# Patient Record
Sex: Female | Born: 1995 | Race: White | Hispanic: No | Marital: Single | Smoking: Former smoker
Health system: Southern US, Community
[De-identification: ages and names within clinical notes are randomized; demographics above are authoritative.]

## PROBLEM LIST (undated history)

## (undated) DIAGNOSIS — F419 Anxiety disorder, unspecified: Secondary | ICD-10-CM

---
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
## 2018-10-04 ENCOUNTER — Encounter: Payer: Self-pay | Admitting: Emergency Medicine

## 2018-10-04 DIAGNOSIS — S022XXA Fracture of nasal bones, initial encounter for closed fracture: Secondary | ICD-10-CM | POA: Insufficient documentation

## 2018-10-04 DIAGNOSIS — F17210 Nicotine dependence, cigarettes, uncomplicated: Secondary | ICD-10-CM | POA: Insufficient documentation

## 2018-10-04 DIAGNOSIS — Y9301 Activity, walking, marching and hiking: Secondary | ICD-10-CM | POA: Insufficient documentation

## 2018-10-04 DIAGNOSIS — Y998 Other external cause status: Secondary | ICD-10-CM | POA: Insufficient documentation

## 2018-10-04 DIAGNOSIS — Y929 Unspecified place or not applicable: Secondary | ICD-10-CM | POA: Insufficient documentation

## 2018-10-04 DIAGNOSIS — W01198A Fall on same level from slipping, tripping and stumbling with subsequent striking against other object, initial encounter: Secondary | ICD-10-CM | POA: Insufficient documentation

## 2018-10-04 NOTE — ED Triage Notes (Signed)
Patient c/o fall, struck head on floor. Patient c/o nasal, facial pain. Patient denies LOC and visual changes. Abrasion noted to nose.

## 2018-10-04 NOTE — ED Notes (Signed)
No orders at this time per MD York Cerise

## 2018-10-05 ENCOUNTER — Other Ambulatory Visit: Payer: Self-pay

## 2018-10-05 ENCOUNTER — Emergency Department
Admission: EM | Admit: 2018-10-05 | Discharge: 2018-10-05 | Disposition: A | Discharge status: Home / Self Care | Payer: Self-pay | Attending: Emergency Medicine | Admitting: Emergency Medicine

## 2018-10-05 DIAGNOSIS — S022XXA Fracture of nasal bones, initial encounter for closed fracture: Secondary | ICD-10-CM

## 2018-10-05 MED ORDER — HYDROCODONE-ACETAMINOPHEN 5-325 MG PO TABS
1.0000 | ORAL_TABLET | Freq: Once | ORAL | Status: AC
  Administered 2018-10-05: 1 via ORAL
  Filled 2018-10-05: qty 1

## 2018-10-05 MED ORDER — IBUPROFEN 600 MG PO TABS: 600.0000 mg | ORAL_TABLET | Freq: Three times a day (TID) | ORAL | 0 refills | Status: DC | PRN

## 2018-10-05 MED ORDER — IBUPROFEN 600 MG PO TABS
600.0000 mg | ORAL_TABLET | Freq: Once | ORAL | Status: AC
  Administered 2018-10-05: 600 mg via ORAL
  Filled 2018-10-05: qty 1

## 2018-10-05 MED ORDER — HYDROCODONE-ACETAMINOPHEN 5-325 MG PO TABS: 1.0000 | ORAL_TABLET | Freq: Four times a day (QID) | ORAL | 0 refills | Status: DC | PRN

## 2018-10-05 MED ORDER — OXYMETAZOLINE HCL 0.05 % NA SOLN
1.0000 | Freq: Once | NASAL | Status: AC
  Administered 2018-10-05: 1 via NASAL
  Filled 2018-10-05: qty 15

## 2018-10-05 NOTE — Discharge Instructions (Signed)
Please take your pain medication as needed for severe symptoms and follow-up with the ear nose and throat specialist in 5 to 7 days for recheck.  Return to the emergency department sooner for any concerns.  While here in the ER today you received very powerful medicine that makes it unsafe for you to drive for the rest of the day.  Do not drive until tomorrow.  It was a pleasure to take care of you today, and thank you for coming to our emergency department.  If you have any questions or concerns before leaving please ask the nurse to grab me and I'm more than happy to go through your aftercare instructions again.  If you were prescribed any opioid pain medication today such as Norco, Vicodin, Percocet, morphine, hydrocodone, or oxycodone please make sure you do not drive when you are taking this medication as it can alter your ability to drive safely.  If you have any concerns once you are home that you are not improving or are in fact getting worse before you can make it to your follow-up appointment, please do not hesitate to call 911 and come back for further evaluation.  Merrily Brittle, MD

## 2018-10-05 NOTE — ED Provider Notes (Signed)
[facility] Emergency Department Provider Note  ____________________________________________   First MD Initiated Contact with Patient 10/05/18 0308     (approximate)  I have reviewed the triage vital signs and the nursing notes.   HISTORY  Chief Complaint Fall   HPI Melinda Perez is a 23 y.o. female self presents to the emergency department with facial pain after she tripped and fell from ground-level striking her nose.  She did have brisk bleeding from her nose initially that is since resolved.  She now has persistent throbbing aching moderate severity discomfort in the front of her face.  She did have some tearing.  She is having congestion at this point.  No double vision or blurred vision.  No nausea or vomiting.  No numbness or weakness.  No seizure-like activity.    History reviewed. No pertinent past medical history.  There are no active problems to display for this patient.   History reviewed. No pertinent surgical history.  Prior to Admission medications   Medication Sig Start Date End Date Taking? Authorizing Provider  HYDROcodone-acetaminophen (NORCO) 5-325 MG tablet Take 1 tablet by mouth every 6 (six) hours as needed for up to 7 doses for severe pain. 10/05/18   Merrily Brittle, MD  ibuprofen (ADVIL,MOTRIN) 600 MG tablet Take 1 tablet (600 mg total) by mouth every 8 (eight) hours as needed. 10/05/18   Merrily Brittle, MD    Allergies Patient has no known allergies.  No family history on file.  Social History Social History   Tobacco Use  . Smoking status: Current Every Day Smoker    Packs/day: 0.50    Types: Cigarettes  . Smokeless tobacco: Never Used  Substance Use Topics  . Alcohol use: Yes  . Drug use: Yes    Review of Systems Constitutional: No fever/chills ENT: Positive for nose pain and congestion Cardiovascular: Denies chest pain. Respiratory: Denies shortness of breath. Gastrointestinal: No abdominal pain.  No  nausea, no vomiting.  Neurological: Positive for headaches   ____________________________________________   PHYSICAL EXAM:  VITAL SIGNS: ED Triage Vitals  Enc Vitals Group     BP 10/04/18 2337 117/81     Pulse Rate 10/04/18 2337 95     Resp 10/04/18 2337 18     Temp 10/04/18 2337 98.1 F (36.7 C)     Temp Source 10/04/18 2337 Oral     SpO2 10/04/18 2337 100 %     Weight 10/04/18 2338 140 lb (63.5 kg)     Height 10/04/18 2338 5\' 8"  (1.727 m)     Head Circumference --      Peak Flow --      Pain Score 10/04/18 2338 7     Pain Loc --      Pain Edu? --      Excl. in [place]? --     Constitutional: Alert and oriented x4 well-appearing nontoxic no diaphoresis speaks in full clear sentences Eyes: Pupils midrange and brisk.  Nose: No septal hematoma.  Left nare with evidence of recent anterior bleed.  Quite tender to the bridge of her nose Mouth/Throat: No trismus Neck: No stridor.   Cardiovascular: Regular rate and rhythm Respiratory: Normal respiratory effort.  No retractions. Neurologic:  Normal speech and language. No gross focal neurologic deficits are appreciated.  Skin:  Skin is warm, dry and intact. No rash noted.    ____________________________________________  LABS (all labs ordered are listed, but only abnormal results are displayed)  Labs Reviewed - No data  to display   __________________________________________  EKG   ____________________________________________  RADIOLOGY   ____________________________________________   DIFFERENTIAL includes but not limited to  Mechanical fall, nasal fracture, septal hematoma, domestic abuse   PROCEDURES  Procedure(s) performed: no  Procedures  Critical Care performed: no  ____________________________________________   INITIAL IMPRESSION / ASSESSMENT AND PLAN / ED COURSE  Pertinent labs & imaging results that were available during my care of the patient were reviewed by me and considered in my medical  decision making (see chart for details).   As part of my medical decision making, I reviewed the following data within the electronic MEDICAL RECORD NUMBER History obtained from family if available, nursing notes, old chart and ekg, as well as notes from prior ED visits.  The patient comes to the emergency department with a convincing story for mechanical fall sustaining a clinical broken nose.  Given Afrin here for congestion along with a tablet of hydrocodone and ibuprofen with improvement in her symptoms.  No indication for imaging and I will refer her to ENT in 5 to 7 days for reevaluation.  No septal hematoma.  Strict return precautions were given.      ____________________________________________   FINAL CLINICAL IMPRESSION(S) / ED DIAGNOSES  Final diagnoses:  Closed fracture of nasal bone, initial encounter      NEW MEDICATIONS STARTED DURING THIS VISIT:  Discharge Medication List as of 10/05/2018  3:16 AM    START taking these medications   Details  HYDROcodone-acetaminophen (NORCO) 5-325 MG tablet Take 1 tablet by mouth every 6 (six) hours as needed for up to 7 doses for severe pain., Starting Fri 10/05/2018, Print    ibuprofen (ADVIL,MOTRIN) 600 MG tablet Take 1 tablet (600 mg total) by mouth every 8 (eight) hours as needed., Starting Fri 10/05/2018, Print         Note:  This document was prepared using Dragon voice recognition software and may include unintentional dictation errors.     Merrily Brittle, MD 10/07/18 2152

## 2019-10-09 ENCOUNTER — Emergency Department
Admission: EM | Admit: 2019-10-09 | Discharge: 2019-10-09 | Disposition: A | Discharge status: Home / Self Care | Payer: No Typology Code available for payment source | Attending: Emergency Medicine | Admitting: Emergency Medicine

## 2019-10-09 ENCOUNTER — Other Ambulatory Visit: Payer: Self-pay

## 2019-10-09 ENCOUNTER — Encounter: Payer: Self-pay | Admitting: Emergency Medicine

## 2019-10-09 DIAGNOSIS — Y9389 Activity, other specified: Secondary | ICD-10-CM | POA: Insufficient documentation

## 2019-10-09 DIAGNOSIS — F17210 Nicotine dependence, cigarettes, uncomplicated: Secondary | ICD-10-CM | POA: Insufficient documentation

## 2019-10-09 DIAGNOSIS — W228XXA Striking against or struck by other objects, initial encounter: Secondary | ICD-10-CM | POA: Diagnosis not present

## 2019-10-09 DIAGNOSIS — Y92511 Restaurant or cafe as the place of occurrence of the external cause: Secondary | ICD-10-CM | POA: Insufficient documentation

## 2019-10-09 DIAGNOSIS — Y990 Civilian activity done for income or pay: Secondary | ICD-10-CM | POA: Insufficient documentation

## 2019-10-09 DIAGNOSIS — S025XXA Fracture of tooth (traumatic), initial encounter for closed fracture: Secondary | ICD-10-CM | POA: Insufficient documentation

## 2019-10-09 DIAGNOSIS — S0993XA Unspecified injury of face, initial encounter: Secondary | ICD-10-CM | POA: Diagnosis present

## 2019-10-09 MED ORDER — IBUPROFEN 800 MG PO TABS: 800.0000 mg | ORAL_TABLET | Freq: Three times a day (TID) | ORAL | 0 refills | Status: DC | PRN

## 2019-10-09 MED ORDER — AMOXICILLIN 500 MG PO CAPS: 500.0000 mg | ORAL_CAPSULE | Freq: Three times a day (TID) | ORAL | 0 refills | Status: DC

## 2019-10-09 MED ORDER — IBUPROFEN 800 MG PO TABS
800.0000 mg | ORAL_TABLET | Freq: Once | ORAL | Status: AC
  Administered 2019-10-09: 21:00:00 800 mg via ORAL
  Filled 2019-10-09: qty 1

## 2019-10-09 MED ORDER — AMOXICILLIN 500 MG PO CAPS
500.0000 mg | ORAL_CAPSULE | Freq: Once | ORAL | Status: AC
  Administered 2019-10-09: 21:00:00 500 mg via ORAL
  Filled 2019-10-09: qty 1

## 2019-10-09 NOTE — ED Notes (Signed)
Completed workers comp with EDT Gladys Damme

## 2019-10-09 NOTE — Discharge Instructions (Signed)
OPTIONS FOR DENTAL FOLLOW UP CARE ° °[place] Department of Health and Human Services - Local Safety Net Dental Clinics °http://www.ncdhhs.gov/dph/oralhealth/services/safetynetclinics.htm °  °[facility] (336-562-3123) ° °[facility] [place] ([phone]) ° °[facility] ([phone] ext [phone]) ° °[facility] ([phone]) ° °[facility] ([phone]) °This clinic caters to the indigent population and is on a lottery system. °Location: °[facility], [address], [place] °Clinic Hours: °Wednesdays from 6pm - 9pm, patients seen by a lottery system. °For dates, call or go to www.med.[facility].edu/[facility]/patients/Dental-[facility] °Services: °Cleanings, fillings and simple extractions. °Payment Options: °DENTAL WORK IS FREE OF CHARGE. Bring proof of income or support. °Best way to get seen: °Arrive at 5:15 pm - this is a lottery, NOT first come/first serve, so arriving earlier will not increase your chances of being seen. °  °  °[facility] °[phone] °Select option 1 for emergencies °  °Location: °[facility], [address], [place] °Clinic Hours: °No walk-ins accepted - call the day before to schedule an appointment. °Check in times are 9:30 am and 1:30 pm. °Services: °Simple extractions, temporary fillings, pulpectomy/pulp debridement, uncomplicated abscess drainage. °Payment Options: °PAYMENT IS DUE AT THE TIME OF SERVICE.  Fee is usually $100-200, additional surgical procedures (e.g. abscess drainage) may be extra. °Cash, checks, Visa/MasterCard accepted.  Can file Medicaid if patient is covered for dental - patient should call case worker to check. °No discount for [facility] patients. °Best way to get seen: °MUST call the day before and get onto the schedule. Can usually be seen the next 1-2 days. No walk-ins accepted. °  °  °[facility] °[phone] °   °Location: °[facility], [address], [place] °Clinic Hours: °M, W, Th, F 8am or 1:30pm, Tues 9a or 1:30 - first come/first served. °Services: °Simple extractions, temporary fillings, uncomplicated abscess drainage.  You do not need to be an [place] resident. °Payment Options: °PAYMENT IS DUE AT THE TIME OF SERVICE. °Dental insurance, otherwise sliding scale - bring proof of income or support. °Depending on income and treatment needed, cost is usually $50-200. °Best way to get seen: °Arrive early as it is first come/first served. °  °  °[facility] °[phone] °  °Location: °[address] °Clinic Hours: °Mon-Thu 8a-5p °Services: °Most basic dental services including extractions and fillings. °Payment Options: °PAYMENT IS DUE AT THE TIME OF SERVICE. °Sliding scale, up to 50% off - bring proof if income or support. °Medicaid with dental option accepted. °Best way to get seen: °Call to schedule an appointment, can usually be seen within 2 weeks OR they will try to see walk-ins - show up at 8a or 2p (you may have to wait). °  °  °[facility] °[phone] °[place] RESIDENTS ONLY °  °Location: °[facility], [address], [place], [place] [address] °Clinic Hours: By appointment only. °Monday - Thursday 8am-5pm, Friday 8am-12pm °Services: Cleanings, fillings, extractions. °Payment Options: °PAYMENT IS DUE AT THE TIME OF SERVICE. °Cash, Visa or MasterCard. Sliding scale - $30 minimum per service. °Best way to get seen: °Come in to office, complete packet and make an appointment - need proof of income °or support monies for each household member and proof of [place] residence. °Usually takes about a month to get in. °  °  °[facility] °[phone] °  °Location: °[address]   [place] °Clinic Hours: Walk-in Urgent Care Dental Services are offered Monday-Friday  mornings only. °The numbers of emergencies accepted daily is limited to the number of °providers available. °Maximum 15 - Mondays, Wednesdays & Thursdays °Maximum 10 - Tuesdays & Fridays °Services: °You do not need to be a [place] resident to be seen for a dental emergency. °Emergencies are defined as pain, swelling, abnormal bleeding, or dental trauma. Walkins will receive x-rays if needed. °NOTE: Dental cleaning is not an emergency. °Payment Options: °PAYMENT IS DUE AT THE TIME OF SERVICE. °Minimum co-pay is $40.00 for uninsured patients. °Minimum co-pay is $3.00 for Medicaid with dental coverage. °Dental Insurance is accepted and must be presented at time of visit. °Medicare does not cover dental. °Forms of payment: Cash, credit card, checks. °Best way to get seen: °If not previously registered with the clinic, walk-in dental registration begins at 7:15 am and is on a first come/first serve basis. °If previously registered with the clinic, call to make an appointment. °  °  °[facility] °[phone] °[place] RESIDENTS ONLY °  °Location: °[address], [place],  °Clinic Hours: °Mon-Thu 10a-2p °Services: Extractions only! °Payment Options: °FREE (donations accepted) - bring proof of income or support °Best way to get seen: °Call and schedule an appointment OR come at 8am on the 1st Monday of every month (except for holidays) when it is first come/first served. °  °  °[facility] °[phone] °  °Location: °[address], [place] °Clinic Hours: °Friday mornings °Services, Payment Options, Best way to get seen: °Call for info °

## 2019-10-09 NOTE — ED Provider Notes (Signed)
[facility] Emergency Department Provider Note  ____________________________________________   First MD Initiated Contact with Patient 10/09/19 2042     (approximate)  I have reviewed the triage vital signs and the nursing notes.   HISTORY  Chief Complaint Mouth Injury    HPI Melinda Perez is a 24 y.o. female presents emergency department stating that on Saturday while at work, she was reaching up to get pizza boxes when her arm gave way and the box fell into her face causing her tooth to crack and injuring the gum of her mouth.  States she is continued have pain in the area.  She states the tooth is actually a baby tooth that never fell out.  She denies any headache, neck pain or other injury.    History reviewed. No pertinent past medical history.  There are no problems to display for this patient.   History reviewed. No pertinent surgical history.  Prior to Admission medications   Medication Sig Start Date End Date Taking? Authorizing Provider  amoxicillin (AMOXIL) 500 MG capsule Take 1 capsule (500 mg total) by mouth 3 (three) times daily. 10/09/19   Raaga Maeder, Linden Dolin, PA-C  ibuprofen (ADVIL) 800 MG tablet Take 1 tablet (800 mg total) by mouth every 8 (eight) hours as needed. 10/09/19   Versie Starks, PA-C    Allergies Patient has no known allergies.  No family history on file.  Social History Social History   Tobacco Use  . Smoking status: Current Every Day Smoker    Packs/day: 0.50    Types: Cigarettes  . Smokeless tobacco: Never Used  Substance Use Topics  . Alcohol use: Yes  . Drug use: Yes    Review of Systems  Constitutional: No fever/chills Eyes: No visual changes. ENT: No sore throat.  Positive for tooth pain Respiratory: Denies cough Genitourinary: Negative for dysuria. Musculoskeletal: Negative for back pain. Skin: Negative for rash.     ____________________________________________   PHYSICAL EXAM:  VITAL  SIGNS: ED Triage Vitals [10/09/19 1818]  Enc Vitals Group     BP 117/75     Pulse Rate 78     Resp 16     Temp 98.5 F (36.9 C)     Temp Source Oral     SpO2 100 %     Weight 135 lb (61.2 kg)     Height 5\' 8"  (1.727 m)     Head Circumference      Peak Flow      Pain Score 4     Pain Loc      Pain Edu?      Excl. in [place]?     Constitutional: Alert and oriented. Well appearing and in no acute distress. Eyes: Conjunctivae are normal.  Head: Atraumatic. Nose: No congestion/rhinnorhea. Mouth/Throat: Mucous membranes are moist.  Positive for a broken tooth in the left upper molars, no bleeding noted at this time Neck:  supple no lymphadenopathy noted Cardiovascular: Normal rate, regular rhythm.  Respiratory: Normal respiratory effort.  No retractions,  GU: deferred Musculoskeletal: FROM all extremities, warm and well perfused Neurologic:  Normal speech and language.  Skin:  Skin is warm, dry and intact. No rash noted. Psychiatric: Mood and affect are normal. Speech and behavior are normal.  ____________________________________________   LABS (all labs ordered are listed, but only abnormal results are displayed)  Labs Reviewed - No data to display ____________________________________________   ____________________________________________  RADIOLOGY    ____________________________________________   PROCEDURES  Procedure(s)  performed: Amoxicillin 500 mg p.o., ibuprofen 800 mg p.o.   Procedures    ____________________________________________   INITIAL IMPRESSION / ASSESSMENT AND PLAN / ED COURSE  Pertinent labs & imaging results that were available during my care of the patient were reviewed by me and considered in my medical decision making (see chart for details).   Patient is 24 year old female presents emergency department due to a mouth injury   Physical exam shows patient very well.  Broken tooth noted in the left upper molars.  Explained findings to  the patient.  She is to follow-up with a dentist.  She was given prescription for amoxicillin and ibuprofen.  She was discharged in stable condition.    Melinda Perez was evaluated in Emergency Department on 10/09/2019 for the symptoms described in the history of present illness. She was evaluated in the context of the global COVID-19 pandemic, which necessitated consideration that the patient might be at risk for infection with the SARS-CoV-2 virus that causes COVID-19. Institutional protocols and algorithms that pertain to the evaluation of patients at risk for COVID-19 are in a state of rapid change based on information released by regulatory bodies including the CDC and federal and state organizations. These policies and algorithms were followed during the patient's care in the ED.   As part of my medical decision making, I reviewed the following data within the electronic MEDICAL RECORD NUMBER Nursing notes reviewed and incorporated, Old chart reviewed, Notes from prior ED visits and [place] Controlled Substance Database  ____________________________________________   FINAL CLINICAL IMPRESSION(S) / ED DIAGNOSES  Final diagnoses:  Closed broken tooth due to trauma without complication, initial encounter      NEW MEDICATIONS STARTED DURING THIS VISIT:  New Prescriptions   AMOXICILLIN (AMOXIL) 500 MG CAPSULE    Take 1 capsule (500 mg total) by mouth 3 (three) times daily.   IBUPROFEN (ADVIL) 800 MG TABLET    Take 1 tablet (800 mg total) by mouth every 8 (eight) hours as needed.     Note:  This document was prepared using Dragon voice recognition software and may include unintentional dictation errors.    Faythe Ghee, PA-C 10/09/19 2055    Emily Filbert, MD 10/09/19 2118

## 2019-10-09 NOTE — ED Triage Notes (Signed)
Patient states she was taking boxes off a shelf at work when a box slipped off the top, hitting her in the mouth. Reports pain to gums and a broken tooth. No bleeding noted.

## 2020-10-10 ENCOUNTER — Encounter: Payer: Self-pay | Admitting: Emergency Medicine

## 2020-10-10 ENCOUNTER — Emergency Department
Admission: EM | Admit: 2020-10-10 | Discharge: 2020-10-10 | Disposition: A | Discharge status: Home / Self Care | Payer: Self-pay | Attending: Emergency Medicine | Admitting: Emergency Medicine

## 2020-10-10 ENCOUNTER — Other Ambulatory Visit: Payer: Self-pay

## 2020-10-10 ENCOUNTER — Emergency Department: Payer: Self-pay

## 2020-10-10 DIAGNOSIS — R0789 Other chest pain: Secondary | ICD-10-CM

## 2020-10-10 DIAGNOSIS — Z87891 Personal history of nicotine dependence: Secondary | ICD-10-CM | POA: Insufficient documentation

## 2020-10-10 HISTORY — DX: Anxiety disorder, unspecified: F41.9

## 2020-10-10 LAB — BASIC METABOLIC PANEL
Anion gap: 11 (ref 5–15)
BUN: 8 mg/dL (ref 6–20)
CO2: 25 mmol/L (ref 22–32)
Calcium: 9.6 mg/dL (ref 8.9–10.3)
Chloride: 102 mmol/L (ref 98–111)
Creatinine, Ser: 0.71 mg/dL (ref 0.44–1.00)
GFR, Estimated: >60 mL/min (ref >60)
Glucose, Bld: 104 mg/dL — ABNORMAL HIGH (ref 70–99)
Potassium: 3.5 mmol/L (ref 3.5–5.1)
Sodium: 138 mmol/L (ref 135–145)

## 2020-10-10 LAB — CBC
HCT: 43 % (ref 36.0–46.0)
Hemoglobin: 14.8 g/dL (ref 12.0–15.0)
MCH: 30.8 pg (ref 26.0–34.0)
MCHC: 34.4 g/dL (ref 30.0–36.0)
MCV: 89.4 fL (ref 80.0–100.0)
Platelets: 240 10*3/uL (ref 150–400)
RBC: 4.81 MIL/uL (ref 3.87–5.11)
RDW: 11.9 % (ref 11.5–15.5)
WBC: 5.9 10*3/uL (ref 4.0–10.5)
nRBC: 0 % (ref 0.0–0.2)

## 2020-10-10 LAB — TROPONIN I (HIGH SENSITIVITY): Troponin I (High Sensitivity): <2 ng/L (ref <18)

## 2020-10-10 LAB — POC URINE PREG, ED: Preg Test, Ur: NEGATIVE

## 2020-10-10 MED ORDER — KETOROLAC TROMETHAMINE 30 MG/ML IJ SOLN
30.0000 mg | Freq: Once | INTRAMUSCULAR | Status: AC
  Administered 2020-10-10: 30 mg via INTRAMUSCULAR
  Filled 2020-10-10: qty 1

## 2020-10-10 MED ORDER — KETOROLAC TROMETHAMINE 10 MG PO TABS: 10.0000 mg | ORAL_TABLET | Freq: Four times a day (QID) | ORAL | 0 refills | Status: AC | PRN

## 2020-10-10 NOTE — Discharge Instructions (Signed)
Please take Toradol up to 4 times a day for the next 5 days to help with chest wall pain. Please limit caffeine at home to minimize anxiety.   Please consider stress management techniques such as yoga, meditation, daily exercise, reading and other activities that you enjoy. I hope you feel better soon.

## 2020-10-10 NOTE — ED Triage Notes (Signed)
Pt presents to the ED via POV. Pt c/o L sided chest pressure and tightness. Pt also states that she has been having intermittent SOB. Pt states she think she had nicotine poisoning and stopped smoking cold [place] 2 days ago. Pt is A&Ox4 and NAD.

## 2020-10-10 NOTE — ED Provider Notes (Signed)
[facility]-EMERGENCY DEPARTMENT  ____________________________________________  Time seen: Approximately 7:22 PM  I have reviewed the triage vital signs and the nursing notes.   HISTORY  Chief Complaint Chest Pain   Historian Patient     HPI Melinda Perez is a 25 y.o. female presents to the emergency department with left-sided chest pain and tightness.  Patient states that she had a panic attack earlier in the week and patient states that she has had reproducible chest wall discomfort since her panic attack occurred.  She states that she is also trying to quit smoking and has been using some nicotine patches and was concerned that her chest pain might be from using her nicotine patches.  She denies rhinorrhea, nasal congestion or nonproductive cough.  She is not currently smoking and has not been taking any birth control.  She denies any history of cardiac issues in the past.  She states that her appetite has been diminished due to anxiety over the past several weeks and she has been concerned.  No other alleviating measures have been attempted.   Past Medical History:  Diagnosis Date  . Anxiety      Immunizations up to date:  Yes.     Past Medical History:  Diagnosis Date  . Anxiety     There are no problems to display for this patient.   No past surgical history on file.  Prior to Admission medications   Medication Sig Start Date End Date Taking? Authorizing Provider  ketorolac (TORADOL) 10 MG tablet Take 1 tablet (10 mg total) by mouth every 6 (six) hours as needed for up to 5 days. 10/10/20 10/15/20 Yes Pia Mau M, PA-C  amoxicillin (AMOXIL) 500 MG capsule Take 1 capsule (500 mg total) by mouth 3 (three) times daily. 10/09/19   Fisher, Roselyn Bering, PA-C  ibuprofen (ADVIL) 800 MG tablet Take 1 tablet (800 mg total) by mouth every 8 (eight) hours as needed. 10/09/19   Faythe Ghee, PA-C    Allergies Patient has no known allergies.  No family history on  file.  Social History Social History   Tobacco Use  . Smoking status: Former Smoker    Packs/day: 0.50    Types: Cigarettes  . Smokeless tobacco: Never Used  Substance Use Topics  . Alcohol use: Yes  . Drug use: Yes     Review of Systems  Constitutional: No fever/chills Eyes:  No discharge ENT: No upper respiratory complaints. Respiratory: no cough. No SOB/ use of accessory muscles to breath. Cardiac: Patient has left sided chest pain.  Gastrointestinal:   No nausea, no vomiting.  No diarrhea.  No constipation. Musculoskeletal: Negative for musculoskeletal pain. Skin: Negative for rash, abrasions, lacerations, ecchymosis.    ____________________________________________   PHYSICAL EXAM:  VITAL SIGNS: ED Triage Vitals  Enc Vitals Group     BP 10/10/20 1427 126/90     Pulse Rate 10/10/20 1427 94     Resp 10/10/20 1427 16     Temp 10/10/20 1427 98.7 F (37.1 C)     Temp Source 10/10/20 1427 Oral     SpO2 10/10/20 1427 96 %     Weight 10/10/20 1435 125 lb (56.7 kg)     Height 10/10/20 1435 5\' 8"  (1.727 m)     Head Circumference --      Peak Flow --      Pain Score 10/10/20 1435 4     Pain Loc --      Pain Edu? --  Excl. in GC? --      Constitutional: Alert and oriented. Well appearing and in no acute distress. Eyes: Conjunctivae are normal. PERRL. EOMI. Head: Atraumatic. Cardiovascular: Normal rate, regular rhythm. Normal S1 and S2.  Good peripheral circulation.  Patient has reproducible left-sided chest wall pain to palpation. Respiratory: Normal respiratory effort without tachypnea or retractions. Lungs CTAB. Good air entry to the bases with no decreased or absent breath sounds Gastrointestinal: Bowel sounds x 4 quadrants. Soft and nontender to palpation. No guarding or rigidity. No distention. Musculoskeletal: Full range of motion to all extremities. No obvious deformities noted Neurologic:  Normal for age. No gross focal neurologic deficits are  appreciated.  Skin:  Skin is warm, dry and intact. No rash noted. Psychiatric: Mood and affect are normal for age. Speech and behavior are normal.   ____________________________________________   LABS (all labs ordered are listed, but only abnormal results are displayed)  Labs Reviewed  BASIC METABOLIC PANEL - Abnormal; Notable for the following components:      Result Value   Glucose, Bld 104 (*)    All other components within normal limits  CBC  POC URINE PREG, ED  TROPONIN I (HIGH SENSITIVITY)  TROPONIN I (HIGH SENSITIVITY)   ____________________________________________  EKG   ____________________________________________  RADIOLOGY Geraldo Pitter, personally viewed and evaluated these images (plain radiographs) as part of my medical decision making, as well as reviewing the written report by the radiologist.  DG Chest 2 View  Result Date: 10/10/2020 CLINICAL DATA:  Chest pain EXAM: CHEST - 2 VIEW COMPARISON:  None. FINDINGS: Lungs are clear.  No pleural effusion or pneumothorax. The heart is normal in size. Visualized osseous structures are within normal limits. IMPRESSION: Normal chest radiographs. Electronically Signed   By: Charline Bills M.D.   On: 10/10/2020 15:18    ____________________________________________    PROCEDURES  Procedure(s) performed:     Procedures     Medications  ketorolac (TORADOL) 30 MG/ML injection 30 mg (has no administration in time range)     ____________________________________________   INITIAL IMPRESSION / ASSESSMENT AND PLAN / ED COURSE  Pertinent labs & imaging results that were available during my care of the patient were reviewed by me and considered in my medical decision making (see chart for details).     Assessment and plan Chest wall pain 25 year old female presents to the emergency department with reproducible left-sided chest wall tenderness to palpation that is occurred for the past several days  following a panic attack.  Vital signs were reassuring at triage.  On physical exam, patient was alert, active and nontoxic-appearing.  She had no increased work of breathing and no adventitious lung sounds were auscultated.  CBC and BMP were reassuring.  Troponin was within reference range.  Urine pregnancy testing was negative.  No consolidations, opacities or infiltrates on chest x-ray.  No signs of cardiac enlargement.  No evidence of pneumothorax.  Highly suspicious for costochondritis secondary to anxiety.  Patient I had an extensive conversation about stress management techniques.  Also recommended that if patient continues to have as much anxiety as she is currently having, to establish care with a primary care provider to discuss possible medication management.  She was given an injection of Toradol in the emergency department for costochondritis and discharged with oral Toradol.  Return precautions were given to return with new or worsening symptoms.  All patient questions were answered.      ____________________________________________  FINAL CLINICAL IMPRESSION(S) / ED  DIAGNOSES  Final diagnoses:  Chest wall pain      NEW MEDICATIONS STARTED DURING THIS VISIT:  ED Discharge Orders         Ordered    ketorolac (TORADOL) 10 MG tablet  Every 6 hours PRN        10/10/20 1913              This chart was dictated using voice recognition software/Dragon. Despite best efforts to proofread, errors can occur which can change the meaning. Any change was purely unintentional.     Orvil Feil, PA-C 10/10/20 Pollie Meyer, MD 10/10/20 [phone]

## 2021-03-09 ENCOUNTER — Other Ambulatory Visit: Payer: Self-pay

## 2021-03-09 ENCOUNTER — Emergency Department
Admission: EM | Admit: 2021-03-09 | Discharge: 2021-03-09 | Disposition: A | Discharge status: Home / Self Care | Payer: Self-pay | Attending: Emergency Medicine | Admitting: Emergency Medicine

## 2021-03-09 DIAGNOSIS — R419 Unspecified symptoms and signs involving cognitive functions and awareness: Secondary | ICD-10-CM | POA: Insufficient documentation

## 2021-03-09 DIAGNOSIS — U071 COVID-19: Secondary | ICD-10-CM | POA: Insufficient documentation

## 2021-03-09 DIAGNOSIS — Z87891 Personal history of nicotine dependence: Secondary | ICD-10-CM | POA: Insufficient documentation

## 2021-03-09 NOTE — ED Triage Notes (Signed)
Pt to ED POV for COVID+, states she is getting better but is having "brain fog" and wants to be checked out for that.  Pt in NAD, RR even and unlabored. Ambulatory, NAD noted  Started having sx on Friday

## 2021-03-09 NOTE — ED Provider Notes (Signed)
[facility]-EMERGENCY DEPARTMENT  ____________________________________________  Time seen: Approximately 6:35 PM  I have reviewed the triage vital signs and the nursing notes.   HISTORY  Chief Complaint COVID+   Historian Patient    HPI Melinda Perez is a 25 y.o. female presents to the emergency department with concern for brain fog that has developed since patient was diagnosed with COVID-19 2 days ago.  Patient states that she normally smokes marijuana but has not been smoking as much since having COVID.  She has had occasional cough body aches and mild diarrhea.  No chest pain, chest tightness or shortness of breath. Patient denies headache. No other alleviating measures have been attempted.     Past Medical History:  Diagnosis Date   Anxiety      Immunizations up to date:  Yes.     Past Medical History:  Diagnosis Date   Anxiety     There are no problems to display for this patient.   No past surgical history on file.  Prior to Admission medications   Not on File    Allergies Patient has no known allergies.  No family history on file.  Social History Social History   Tobacco Use   Smoking status: Former    Packs/day: 0.50    Pack years: 0.00    Types: Cigarettes   Smokeless tobacco: Never  Substance Use Topics   Alcohol use: Yes   Drug use: Yes     Review of Systems  Constitutional: No fever/chills Eyes:  No discharge ENT: No upper respiratory complaints. Respiratory: no cough. No SOB/ use of accessory muscles to breath Gastrointestinal:   No nausea, no vomiting.  No diarrhea.  No constipation. Musculoskeletal: Negative for musculoskeletal pain. Skin: Negative for rash, abrasions, lacerations, ecchymosis.   ____________________________________________   PHYSICAL EXAM:  VITAL SIGNS: ED Triage Vitals  Enc Vitals Group     BP 03/09/21 1718 (!) 123/102     Pulse Rate 03/09/21 1718 85     Resp 03/09/21 1718 18     Temp 03/09/21 1718  98.3 F (36.8 C)     Temp Source 03/09/21 1718 Oral     SpO2 03/09/21 1718 98 %     Weight 03/09/21 1718 125 lb (56.7 kg)     Height 03/09/21 1718 5\' 8"  (1.727 m)     Head Circumference --      Peak Flow --      Pain Score 03/09/21 1718 3     Pain Loc --      Pain Edu? --      Excl. in GC? --      Constitutional: Alert and oriented. Patient is lying supine. Eyes: Conjunctivae are normal. PERRL. EOMI. Head: Atraumatic. ENT:      Ears: Tympanic membranes are mildly injected with mild effusion bilaterally.       Nose: No congestion/rhinnorhea.      Mouth/Throat: Mucous membranes are moist. Posterior pharynx is mildly erythematous.  Hematological/Lymphatic/Immunilogical: No cervical lymphadenopathy.  Cardiovascular: Normal rate, regular rhythm. Normal S1 and S2.  Good peripheral circulation. Respiratory: Normal respiratory effort without tachypnea or retractions. Lungs CTAB. Good air entry to the bases with no decreased or absent breath sounds. Gastrointestinal: Bowel sounds 4 quadrants. Soft and nontender to palpation. No guarding or rigidity. No palpable masses. No distention. No CVA tenderness. Musculoskeletal: Full range of motion to all extremities. No gross deformities appreciated. Neurologic:  Normal speech and language. No gross focal neurologic deficits are appreciated.  Skin:  Skin is warm, dry and intact. No rash noted. Psychiatric: Mood and affect are normal. Speech and behavior are normal. Patient exhibits appropriate insight and judgement.   ____________________________________________   LABS (all labs ordered are listed, but only abnormal results are displayed)  Labs Reviewed - No data to display ____________________________________________  EKG   ____________________________________________  RADIOLOGY   No results found.  ____________________________________________    PROCEDURES  Procedure(s) performed:     Procedures     Medications - No  data to display   ____________________________________________   INITIAL IMPRESSION / ASSESSMENT AND PLAN / ED COURSE  Pertinent labs & imaging results that were available during my care of the patient were reviewed by me and considered in my medical decision making (see chart for details).      Assessment and plan COVID-18 25 year old female presents to the emergency department with concern for brain fog that has developed since she was diagnosed with COVID-19 2 days ago.  Patient was hypertensive at triage but vital signs otherwise reassuring.  No neurodeficits on exam.  Recommended rest and hydration at home.  Recommended follow-up with her primary care provider as needed.  Patient was advised that brain fog should improve as patient recovers from COVID-19.  All patient questions were answered.     ____________________________________________  FINAL CLINICAL IMPRESSION(S) / ED DIAGNOSES  Final diagnoses:  COVID-19      NEW MEDICATIONS STARTED DURING THIS VISIT:  ED Discharge Orders     None           This chart was dictated using voice recognition software/Dragon. Despite best efforts to proofread, errors can occur which can change the meaning. Any change was purely unintentional.     Gasper Lloyd 03/09/21 Shona Simpson, MD 03/10/21 [phone]

## 2021-03-09 NOTE — ED Notes (Signed)
See triage note  Presents with COVID and feels like she is in a fog  Afebrile on arrival

## 2021-03-12 ENCOUNTER — Emergency Department: Payer: Medicaid Other

## 2021-03-12 ENCOUNTER — Emergency Department
Admission: EM | Admit: 2021-03-12 | Discharge: 2021-03-12 | Disposition: A | Discharge status: Home / Self Care | Payer: Medicaid Other | Attending: Emergency Medicine | Admitting: Emergency Medicine

## 2021-03-12 ENCOUNTER — Other Ambulatory Visit: Payer: Self-pay

## 2021-03-12 DIAGNOSIS — Z87891 Personal history of nicotine dependence: Secondary | ICD-10-CM | POA: Insufficient documentation

## 2021-03-12 DIAGNOSIS — R110 Nausea: Secondary | ICD-10-CM | POA: Insufficient documentation

## 2021-03-12 DIAGNOSIS — R0981 Nasal congestion: Secondary | ICD-10-CM | POA: Insufficient documentation

## 2021-03-12 DIAGNOSIS — U071 COVID-19: Secondary | ICD-10-CM

## 2021-03-12 MED ORDER — BENZONATATE 100 MG PO CAPS: 100.0000 mg | ORAL_CAPSULE | Freq: Three times a day (TID) | ORAL | 0 refills | Status: AC | PRN

## 2021-03-12 NOTE — ED Triage Notes (Signed)
Pt states she is covid + and having a fever with congested cough that she says she is not able to get the mucous up since last Friday.Marland Kitchen

## 2021-03-12 NOTE — ED Provider Notes (Signed)
[facility] Emergency Department Provider Note  ____________________________________________   Event Date/Time   First MD Initiated Contact with Patient 03/12/21 1351     (approximate)  I have reviewed the triage vital signs and the nursing notes.   HISTORY  Chief Complaint Covid Positive   HPI Melinda Perez is a 25 y.o. female with no significant past medical history who presents for assessment of some cough, shortness of breath, fevers, headache, congestion and myalgias that she states have been ongoing for just over 7 days.  She states she sometimes little chest tightness when she is coughing a lot or at night.  No hemoptysis, abdominal pain but she does endorse some nausea and diarrhea.  No acute back pain, urinary symptoms, rash or extremity pain.  No recent falls or injuries.  She is taking OTC meds without improvement in her symptoms.  No history of asthma COPD or tobacco abuse.  No other acute concerns at this time.          Past Medical History:  Diagnosis Date   Anxiety     There are no problems to display for this patient.   History reviewed. No pertinent surgical history.  Prior to Admission medications   Medication Sig Start Date End Date Taking? Authorizing Provider  benzonatate (TESSALON PERLES) 100 MG capsule Take 1 capsule (100 mg total) by mouth 3 (three) times daily as needed for up to 5 days for cough. 03/12/21 03/17/21 Yes Gilles Chiquito, MD    Allergies Patient has no known allergies.  No family history on file.  Social History Social History   Tobacco Use   Smoking status: Former    Packs/day: 0.50    Pack years: 0.00    Types: Cigarettes   Smokeless tobacco: Never  Substance Use Topics   Alcohol use: Yes   Drug use: Yes    Review of Systems  Review of Systems  Constitutional:  Positive for chills, fever and malaise/fatigue.  HENT:  Positive for congestion. Negative for sore throat.   Eyes:  Negative  for pain.  Respiratory:  Positive for cough and shortness of breath. Negative for stridor.   Cardiovascular:  Positive for chest pain (intermittently w/ coughing).  Gastrointestinal:  Negative for vomiting.  Genitourinary:  Negative for dysuria.  Musculoskeletal:  Positive for myalgias.  Skin:  Negative for rash.  Neurological:  Positive for headaches. Negative for seizures and loss of consciousness.  Psychiatric/Behavioral:  Negative for suicidal ideas.   All other systems reviewed and are negative.    ____________________________________________   PHYSICAL EXAM:  VITAL SIGNS: ED Triage Vitals  Enc Vitals Group     BP 03/12/21 1244 114/83     Pulse Rate 03/12/21 1244 81     Resp 03/12/21 1244 16     Temp 03/12/21 1244 98.9 F (37.2 C)     Temp Source 03/12/21 1244 Oral     SpO2 03/12/21 1244 99 %     Weight 03/12/21 1244 125 lb (56.7 kg)     Height 03/12/21 1244 5\' 8"  (1.727 m)     Head Circumference --      Peak Flow --      Pain Score 03/12/21 1243 0     Pain Loc --      Pain Edu? --      Excl. in GC? --    Vitals:   03/12/21 1244  BP: 114/83  Pulse: 81  Resp: 16  Temp: 98.9 F (  37.2 C)  SpO2: 99%   Physical Exam Vitals and nursing note reviewed.  Constitutional:      General: She is not in acute distress.    Appearance: She is well-developed.  HENT:     Head: Normocephalic and atraumatic.     Right Ear: External ear normal.     Left Ear: External ear normal.     Nose: Nose normal.     Mouth/Throat:     Mouth: Mucous membranes are moist.  Eyes:     Conjunctiva/sclera: Conjunctivae normal.  Cardiovascular:     Rate and Rhythm: Normal rate and regular rhythm.     Heart sounds: No murmur heard. Pulmonary:     Effort: Pulmonary effort is normal. No respiratory distress.     Breath sounds: Normal breath sounds.  Abdominal:     Palpations: Abdomen is soft.     Tenderness: There is no abdominal tenderness.  Musculoskeletal:     Cervical back: Neck  supple.  Skin:    General: Skin is warm and dry.     Capillary Refill: Capillary refill takes less than 2 seconds.  Neurological:     Mental Status: She is alert and oriented to person, place, and time.  Psychiatric:        Mood and Affect: Mood normal.     ____________________________________________   LABS (all labs ordered are listed, but only abnormal results are displayed)  Labs Reviewed - No data to display ____________________________________________  EKG  Sinus rhythm with ventricular rate of 83, normal axis, unremarkable intervals without evidence of acute ischemia or significant underlying arrhythmia. ____________________________________________  RADIOLOGY  ED MD interpretation: No focal consolidation, large effusion, significant IMA, pneumothorax or other clear acute thoracic process.   Official radiology report(s): DG Chest Portable 1 View  Result Date: 03/12/2021 CLINICAL DATA:  Cough, COVID fever and congested. EXAM: PORTABLE CHEST 1 VIEW COMPARISON:  October 10, 2020. FINDINGS: Trachea is midline. Cardiomediastinal contours and hilar structures are normal. Lungs are clear. No sign of effusion on frontal radiograph.  No pneumothorax. On limited assessment no acute skeletal process. IMPRESSION: No acute cardiopulmonary disease. Electronically Signed   By: Donzetta Kohut M.D.   On: 03/12/2021 14:29    ____________________________________________   PROCEDURES  Procedure(s) performed (including Critical Care):  Procedures   ____________________________________________   INITIAL IMPRESSION / ASSESSMENT AND PLAN / ED COURSE      Patient presents with above to history exam for some persistent cough, congestion, headaches, fevers, fatigue and some intermittent chest tightness over the last week or so since recently taking a COVID test at home which was positive.  On arrival she is afebrile and hemodynamically stable.  I suspect symptoms are likely related to  ongoing COVID-19 bronchitis with some mild enteritis.  Overall Evalose patient for PE as patient is PERC negative.  Chest x-ray has no evidence of heart failure, focal consolidation suggest bacterial pneumonia or effusion.  ECG has no evidence of ischemia or arrhythmia no prolonged very low suspicion for ACS or myocarditis.  Patient does not appear septic or significantly clinically dehydrated.  Advised patient on expected clinical course of several more days of cough congestion shortness of breath myalgias and fever recommendation to take Tylenol and ibuprofen.  Will write short course of Tessalon.  Advise close outpatient PCP follow-up.  Discharged stable condition.  Strict return precautions advised and discussed.        ____________________________________________   FINAL CLINICAL IMPRESSION(S) / ED DIAGNOSES  Final diagnoses:  COVID    Medications - No data to display   ED Discharge Orders          Ordered    benzonatate (TESSALON PERLES) 100 MG capsule  3 times daily PRN        03/12/21 1412             Note:  This document was prepared using Dragon voice recognition software and may include unintentional dictation errors.    Gilles Chiquito, MD 03/12/21 [phone]

## 2021-03-12 NOTE — ED Notes (Signed)
Patient transported to X-ray 

## 2022-09-16 IMAGING — CR DG CHEST 2V
1 series · 2 of 2 positions shown · non-contrast
Comparison: None.

CLINICAL DATA: Chest pain

EXAM:
CHEST - 2 VIEW

[Series 1: dg chest 2 view · 0.14mm/px · 2 of 2 slices shown]
[im 1/2]
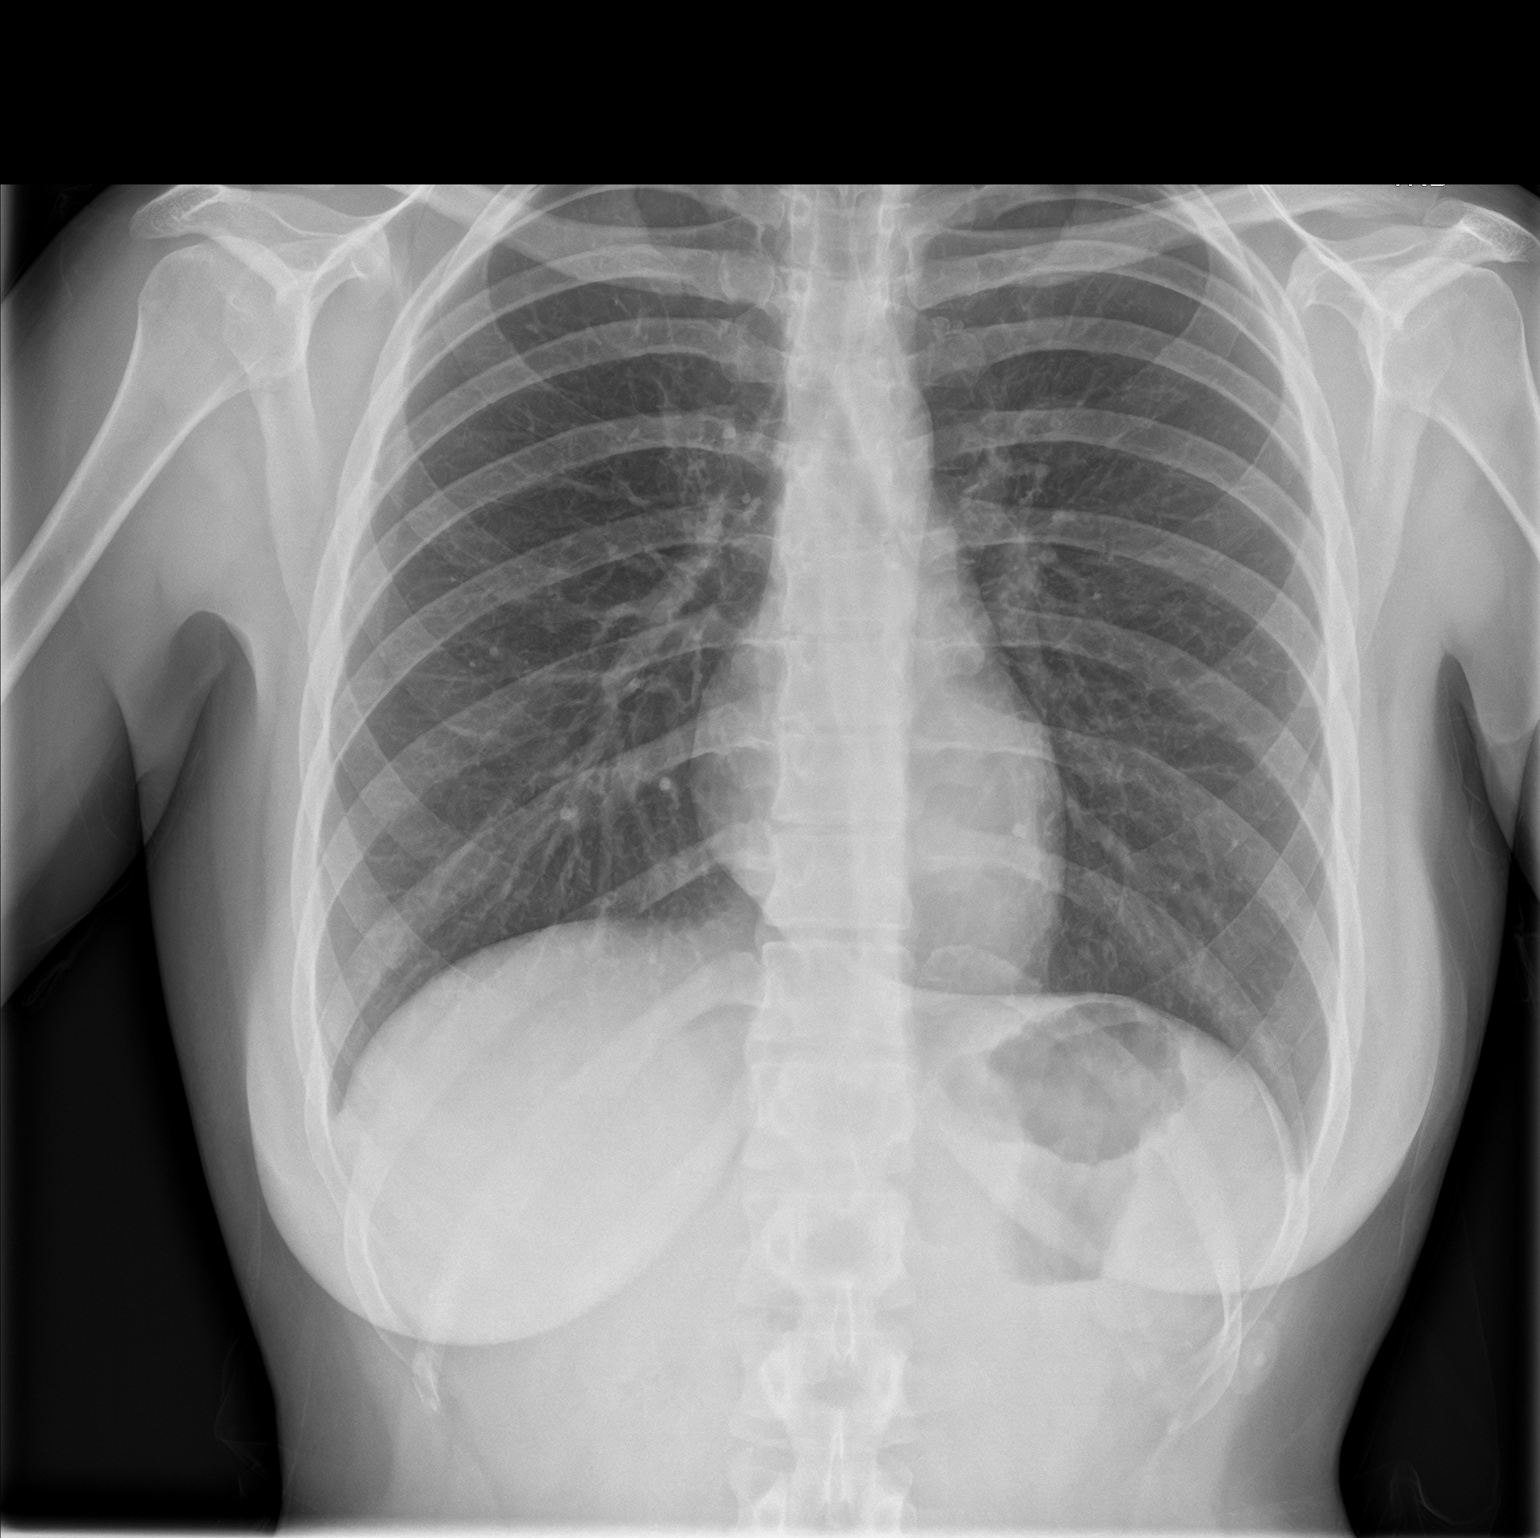
[im 2/2]
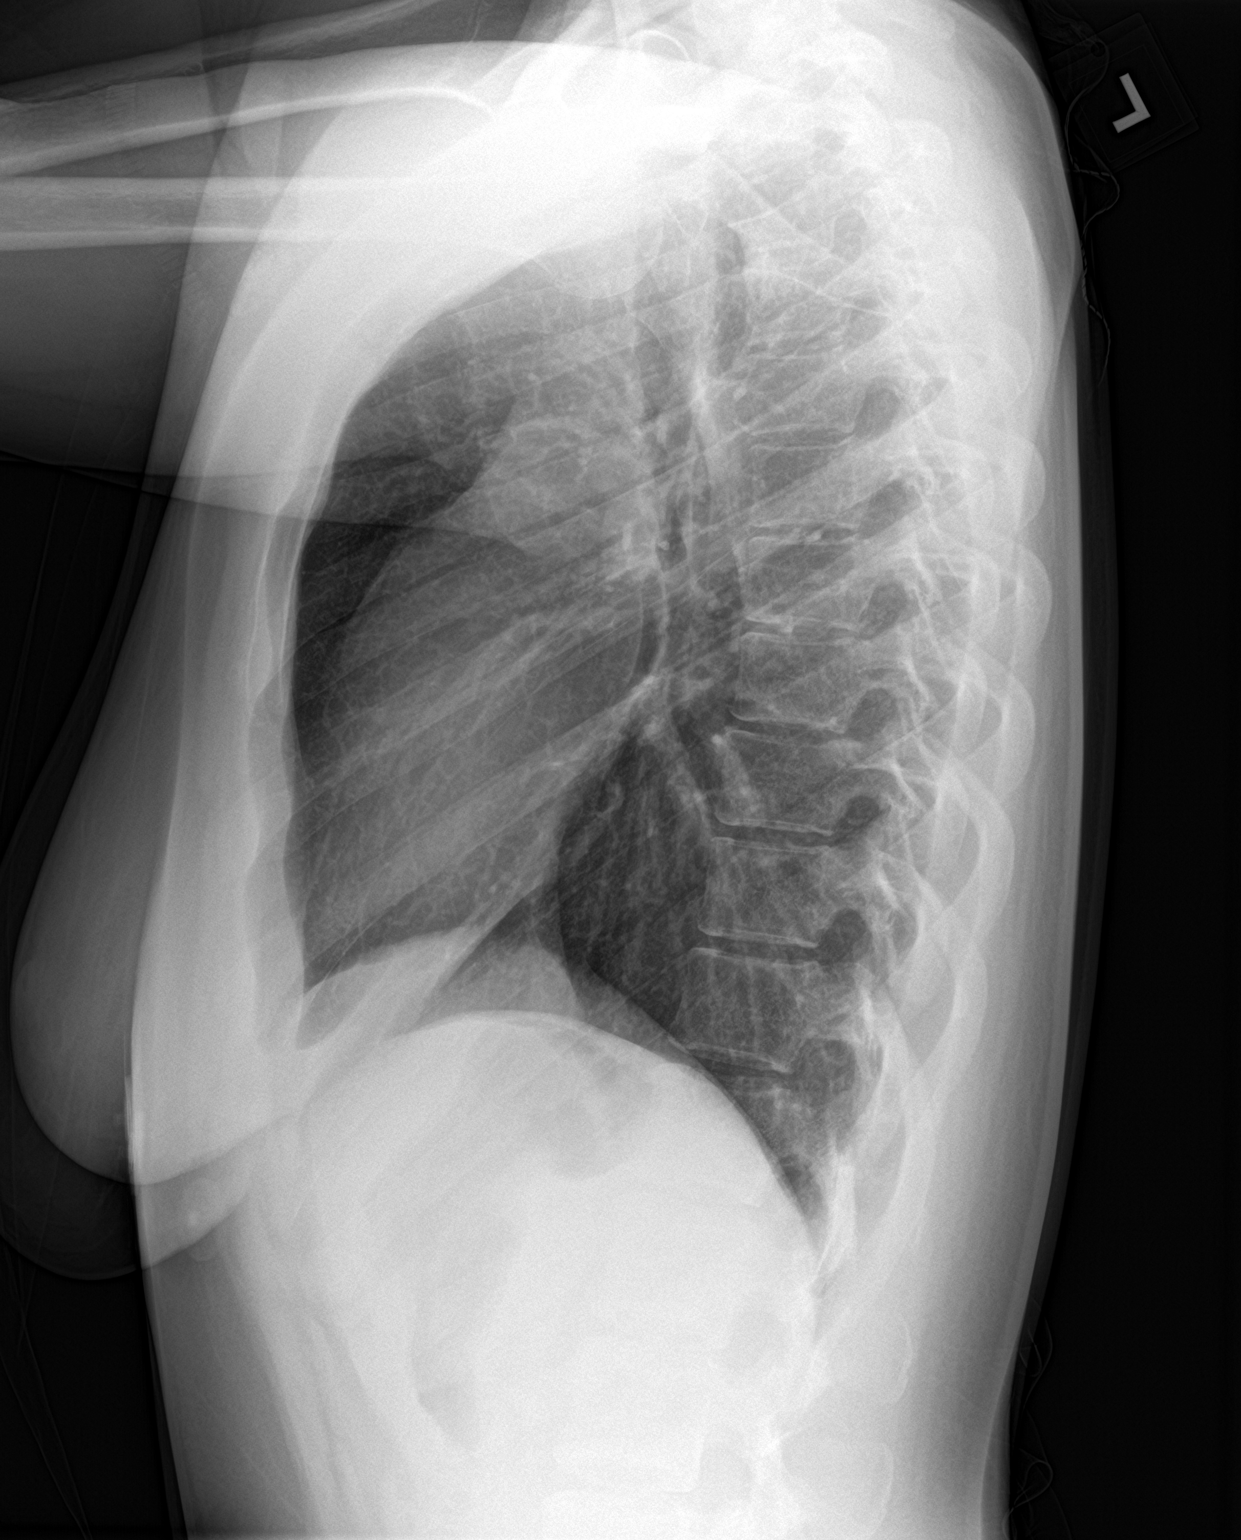

[2 of 2 positions shown; findings below may reference images not displayed]

FINDINGS: Lungs are clear.  No pleural effusion or pneumothorax.

The heart is normal in size.

Visualized osseous structures are within normal limits.
IMPRESSION: Normal chest radiographs.

## 2023-02-16 IMAGING — DX DG CHEST 1V PORT
1 series · 1 of 1 positions shown · non-contrast
Comparison: October 10, 2020.

CLINICAL DATA: Cough, COVID fever and congested.

EXAM:
PORTABLE CHEST 1 VIEW

[chest ap]
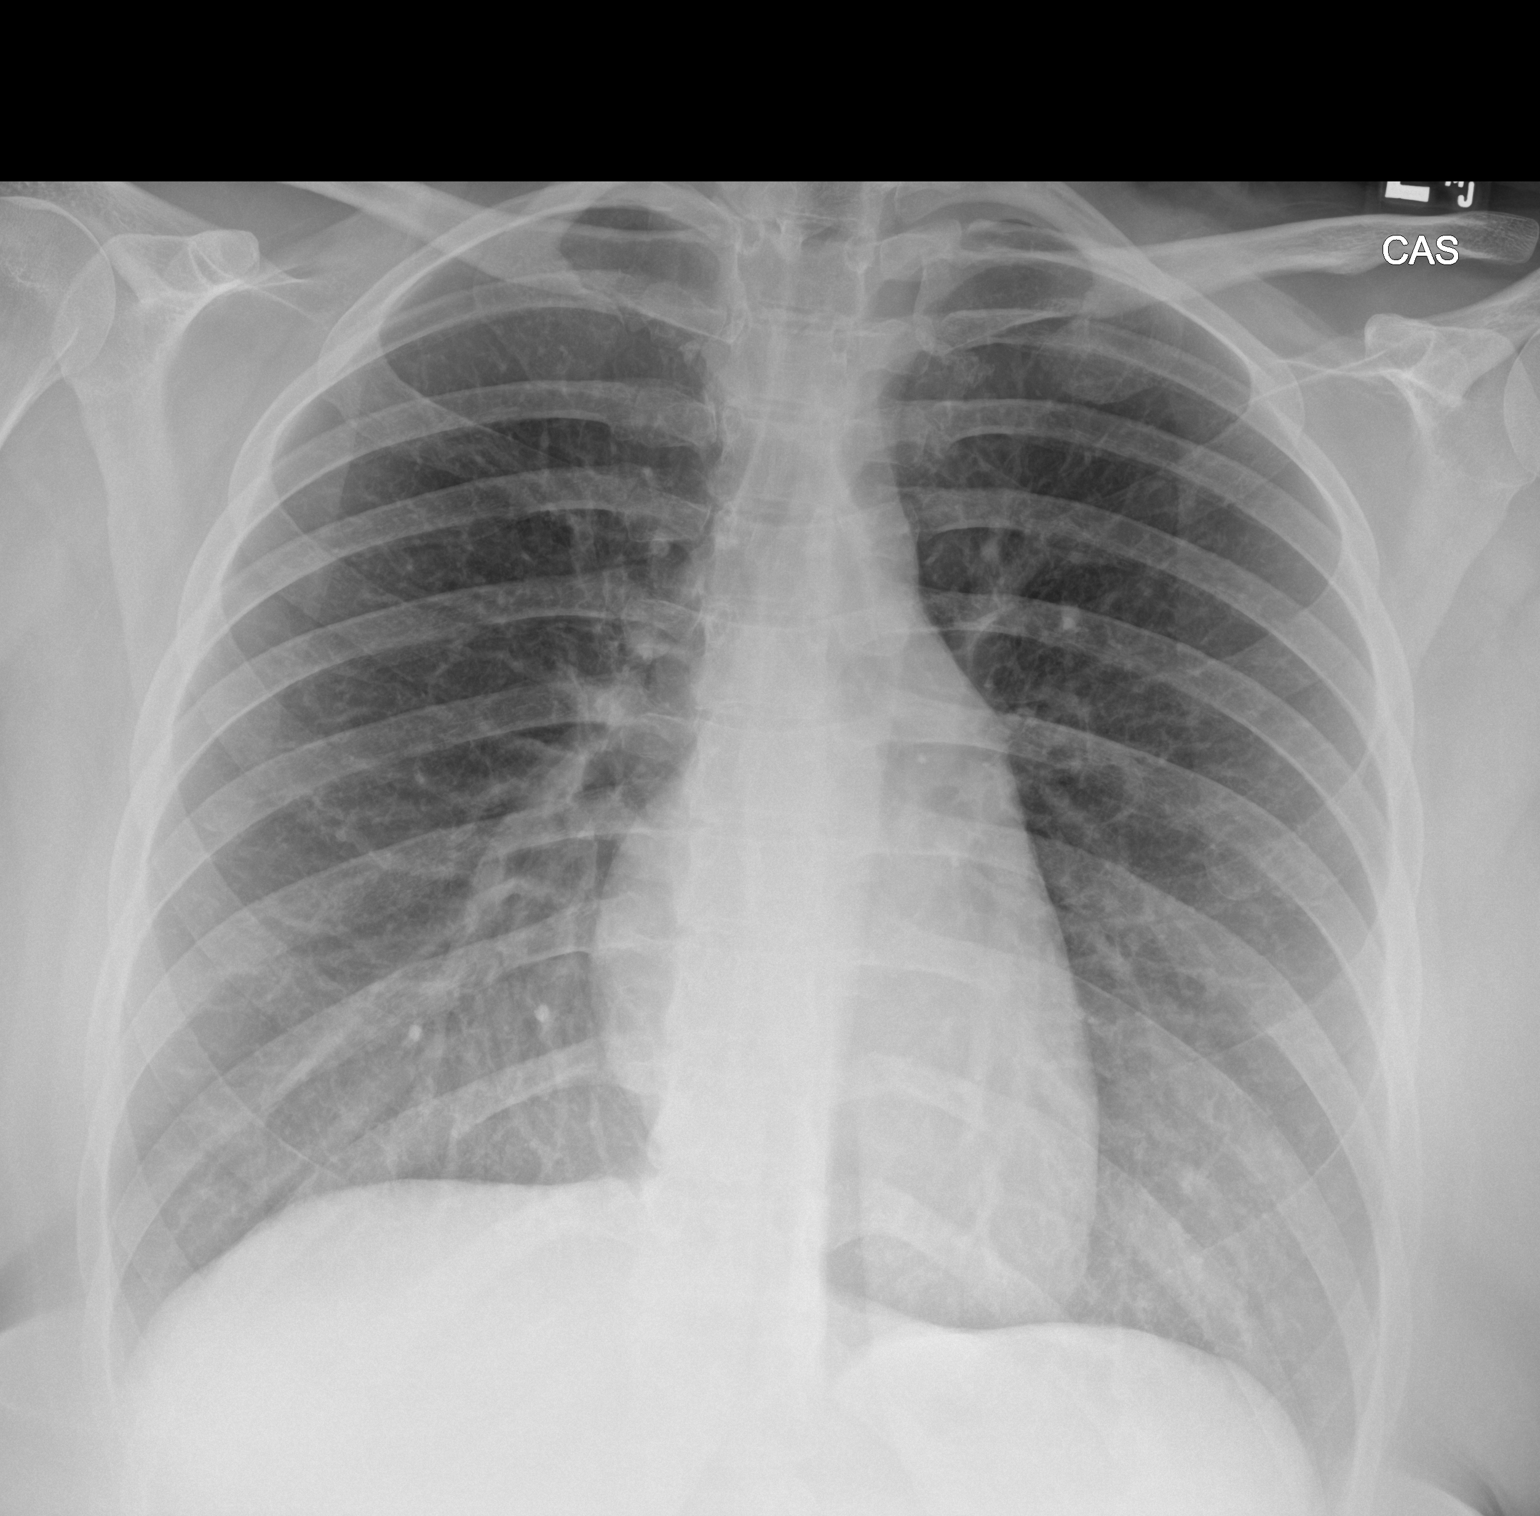

[1 of 1 positions shown; findings below may reference images not displayed]

FINDINGS: Trachea is midline. Cardiomediastinal contours and hilar structures
are normal.

Lungs are clear.

No sign of effusion on frontal radiograph.  No pneumothorax.

On limited assessment no acute skeletal process.
IMPRESSION: No acute cardiopulmonary disease.
